# Patient Record
Sex: Male | Born: 1965 | Race: White | Hispanic: No | Marital: Married | Smoking: Never smoker
Health system: Southern US, Community
[De-identification: ages and names within clinical notes are randomized; demographics above are authoritative.]

## PROBLEM LIST (undated history)

## (undated) DIAGNOSIS — M5126 Other intervertebral disc displacement, lumbar region: Secondary | ICD-10-CM

## (undated) DIAGNOSIS — I861 Scrotal varices: Secondary | ICD-10-CM

## (undated) HISTORY — DX: Scrotal varices: I86.1

## (undated) HISTORY — PX: BACK SURGERY: SHX140

## (undated) HISTORY — DX: Other intervertebral disc displacement, lumbar region: M51.26

---

## 2009-12-03 HISTORY — PX: ANTERIOR CRUCIATE LIGAMENT REPAIR: SHX115

## 2012-12-03 DIAGNOSIS — M5126 Other intervertebral disc displacement, lumbar region: Secondary | ICD-10-CM

## 2012-12-03 HISTORY — DX: Other intervertebral disc displacement, lumbar region: M51.26

## 2014-12-17 DIAGNOSIS — G8929 Other chronic pain: Secondary | ICD-10-CM | POA: Insufficient documentation

## 2014-12-17 DIAGNOSIS — M545 Low back pain: Secondary | ICD-10-CM

## 2015-02-01 DIAGNOSIS — I861 Scrotal varices: Secondary | ICD-10-CM

## 2015-02-01 HISTORY — DX: Scrotal varices: I86.1

## 2015-02-09 ENCOUNTER — Ambulatory Visit (INDEPENDENT_AMBULATORY_CARE_PROVIDER_SITE_OTHER): Payer: 59 | Admitting: Family Medicine

## 2015-02-09 ENCOUNTER — Other Ambulatory Visit: Payer: Self-pay | Admitting: Family Medicine

## 2015-02-09 ENCOUNTER — Encounter: Payer: Self-pay | Admitting: Family Medicine

## 2015-02-09 VITALS — BP 110/70 | HR 61 | Temp 97.4°F | Ht 73.0 in | Wt 194.0 lb

## 2015-02-09 DIAGNOSIS — N50812 Left testicular pain: Secondary | ICD-10-CM

## 2015-02-09 DIAGNOSIS — N508 Other specified disorders of male genital organs: Secondary | ICD-10-CM

## 2015-02-09 DIAGNOSIS — Z0000 Encounter for general adult medical examination without abnormal findings: Secondary | ICD-10-CM | POA: Diagnosis not present

## 2015-02-09 NOTE — Progress Notes (Signed)
Office Note 02/09/2015  CC:  Chief Complaint  Patient presents with  . Establish Care   HPI:  Howard Griffin is a [age] y.o. White male who is here to establish care. Patient's most recent primary MD: [place] was in [place], [place] couple years ago. Old records were not reviewed prior to or during today's visit.  Pain in left testicle last couple months.  It is present intermittently but is present more than not.  It is mild/moderate in intensity.  No hx of trauma/injury. Worse when sitting.  He thinks he feels like something feels different down there.  No hx of prostatitis or epididymo-orchitis.  Has not seen an MD for this before.  Past Medical History  Diagnosis Date  . HNP (herniated nucleus pulposus), lumbar 2014    L5/S1    Past Surgical History  Procedure Laterality Date  . Anterior cruciate ligament repair Left 2011    x2 (ACL and med meniscus repaired)  . Back surgery      L5 herniated disc; residual numbness in left foot.    History reviewed. No pertinent family history.  History   Social History  . Marital Status: Married    Spouse Name: N/A  . Number of Children: N/A  . Years of Education: N/A   Occupational History  . Not on file.   Social History Main Topics  . Smoking status: Never Smoker   . Smokeless tobacco: Never Used  . Alcohol Use: No  . Drug Use: No  . Sexual Activity: Not on file   Other Topics Concern  . Not on file   Social History Narrative   Married, 2 daughters.   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   [place]ig from [place]   No T/A/Ds.   Exercises regularly at [employer].   Diet: regular american diet.   Pt adopted, so FH unknown.    Outpatient Encounter Prescriptions as of 02/09/2015  Medication Sig  . gabapentin (NEURONTIN) 100 MG capsule   . [DISCONTINUED] GABAPENTIN PO Take by mouth as needed.    Allergies  Allergen Reactions  . Penicillins Swelling    ROS Review of Systems  Constitutional: Negative for  fever, chills, appetite change and fatigue.  HENT: Negative for congestion, dental problem, ear pain and sore throat.   Eyes: Negative for discharge, redness and visual disturbance.  Respiratory: Negative for cough, chest tightness, shortness of breath and wheezing.   Cardiovascular: Negative for chest pain, palpitations and leg swelling.  Gastrointestinal: Negative for nausea, vomiting, abdominal pain, diarrhea and blood in stool.  Genitourinary: Positive for testicular pain (see HPI). Negative for dysuria, urgency, frequency, hematuria, flank pain and difficulty urinating.  Musculoskeletal: Negative for myalgias, back pain, joint swelling, arthralgias and neck stiffness.  Skin: Negative for pallor and rash.  Neurological: Negative for dizziness, speech difficulty, weakness and headaches.  Hematological: Negative for adenopathy. Does not bruise/bleed easily.  Psychiatric/Behavioral: Negative for confusion and sleep disturbance. The patient is not nervous/anxious.     PE; Blood pressure 110/70, pulse 61, temperature 97.4 F (36.3 C), temperature source Oral, height 6\' 1"  (1.854 m), weight 194 lb (87.998 kg), SpO2 97 %. Gen: Alert, well appearing.  Patient is oriented to person, place, time, and situation. AFFECT: pleasant, lucid thought and speech. ENT: Ears: EACs clear, normal epithelium.  TMs with good light reflex and landmarks bilaterally.  Eyes: no injection, icteris, swelling, or exudate.  EOMI, PERRLA. Nose: no drainage or turbinate edema/swelling.  No injection or  focal lesion.  Mouth: lips without lesion/swelling.  Oral mucosa pink and moist.  Dentition intact and without obvious caries or gingival swelling.  Oropharynx without erythema, exudate, or swelling.  Neck: supple/nontender.  No LAD, mass, or TM.  Carotid pulses 2+ bilaterally, without bruits. CV: RRR, no m/r/g.   LUNGS: CTA bilat, nonlabored resps, good aeration in all lung fields. ABD: soft, NT, ND, BS normal.  No  hepatospenomegaly or mass.  No bruits. EXT: no clubbing, cyanosis, or edema.  Musculoskeletal: no joint swelling, erythema, warmth, or tenderness.  ROM of all joints intact. Skin - no sores or suspicious lesions or rashes or color changes Genitals normal; both testes normal.  Right scrotum/testicle without tenderness, masses, hydroceles, varicoceles, erythema or swelling.   On the left, there is mild tenderness over left epididymus and also in left sided vascular structures/vas area.  However, no mass/nodule palpable. Shaft normal, circumcised, meatus normal without discharge. No inguinal hernia noted. No inguinal lymphadenopathy.  Pertinent labs:  none  ASSESSMENT AND PLAN:   New pt; no pertinent old records to obtain.  Testicular pain, left Given insidious onset/nature of the complaint I have low suspicion of epididymal infection. Suspect possible varicocele and/or epididymal cyst or spermatocele.  No testicular mass was palpated. I will obtain a scrotal u/s w/duplex art/ven dopplers of scrotum to further evaluate.   Health maintenance examination Reviewed age and gender appropriate health maintenance issues (prudent diet, regular exercise, health risks of tobacco and excessive alcohol, use of seatbelts, fire alarms in home, use of sunscreen).  Also reviewed age and gender appropriate health screening as well as vaccine recommendations. Vaccines UTD. Not fasting today, so his health panel labs were ordered for future and he'll return for fasting lab visit at his earliest convenience. He declined HIV screening.   An After Visit Summary was printed and given to the patient.  Return if symptoms worsen or fail to improve, for pt needs lab visit appt for fasting labs.

## 2015-02-09 NOTE — Assessment & Plan Note (Signed)
Reviewed age and gender appropriate health maintenance issues (prudent diet, regular exercise, health risks of tobacco and excessive alcohol, use of seatbelts, fire alarms in home, use of sunscreen).  Also reviewed age and gender appropriate health screening as well as vaccine recommendations. Vaccines UTD. Not fasting today, so his health panel labs were ordered for future and he'll return for fasting lab visit at his earliest convenience. He declined HIV screening.

## 2015-02-09 NOTE — Progress Notes (Signed)
Pre visit review using our clinic review tool, if applicable. No additional management support is needed unless otherwise documented below in the visit note. 

## 2015-02-09 NOTE — Assessment & Plan Note (Signed)
Given insidious onset/nature of the complaint I have low suspicion of epididymal infection. Suspect possible varicocele and/or epididymal cyst or spermatocele.  No testicular mass was palpated. I will obtain a scrotal u/s w/duplex art/ven dopplers of scrotum to further evaluate.

## 2015-02-13 ENCOUNTER — Ambulatory Visit (HOSPITAL_BASED_OUTPATIENT_CLINIC_OR_DEPARTMENT_OTHER): Payer: 59

## 2015-02-13 ENCOUNTER — Ambulatory Visit (HOSPITAL_BASED_OUTPATIENT_CLINIC_OR_DEPARTMENT_OTHER)
Admission: RE | Admit: 2015-02-13 | Discharge: 2015-02-13 | Disposition: A | Discharge status: Home / Self Care | Payer: 59 | Source: Ambulatory Visit | Attending: Family Medicine | Admitting: Family Medicine

## 2015-02-13 DIAGNOSIS — N50812 Left testicular pain: Secondary | ICD-10-CM

## 2015-02-13 DIAGNOSIS — N508 Other specified disorders of male genital organs: Secondary | ICD-10-CM | POA: Insufficient documentation

## 2015-02-14 ENCOUNTER — Other Ambulatory Visit: Payer: Self-pay | Admitting: Family Medicine

## 2015-02-14 DIAGNOSIS — I861 Scrotal varices: Secondary | ICD-10-CM

## 2015-02-21 ENCOUNTER — Other Ambulatory Visit (INDEPENDENT_AMBULATORY_CARE_PROVIDER_SITE_OTHER): Payer: 59

## 2015-02-21 DIAGNOSIS — Z0000 Encounter for general adult medical examination without abnormal findings: Secondary | ICD-10-CM

## 2015-02-21 LAB — CBC WITH DIFFERENTIAL/PLATELET
Basophils Absolute: 0 10*3/uL (ref 0.0–0.1)
Basophils Relative: 0.7 % (ref 0.0–3.0)
EOS PCT: 1.8 % (ref 0.0–5.0)
Eosinophils Absolute: 0.1 10*3/uL (ref 0.0–0.7)
HCT: 46.3 % (ref 39.0–52.0)
HEMOGLOBIN: 15.8 g/dL (ref 13.0–17.0)
LYMPHS PCT: 33.2 % (ref 12.0–46.0)
Lymphs Abs: 1.8 10*3/uL (ref 0.7–4.0)
MCHC: 34.1 g/dL (ref 30.0–36.0)
MCV: 82.5 fl (ref 78.0–100.0)
Monocytes Absolute: 0.5 10*3/uL (ref 0.1–1.0)
Monocytes Relative: 8.8 % (ref 3.0–12.0)
NEUTROS PCT: 55.5 % (ref 43.0–77.0)
Neutro Abs: 3 10*3/uL (ref 1.4–7.7)
Platelets: 233 10*3/uL (ref 150.0–400.0)
RBC: 5.61 Mil/uL (ref 4.22–5.81)
RDW: 14.2 % (ref 11.5–15.5)
WBC: 5.4 10*3/uL (ref 4.0–10.5)

## 2015-02-21 LAB — COMPREHENSIVE METABOLIC PANEL
ALK PHOS: 44 U/L (ref 39–117)
ALT: 26 U/L (ref 0–53)
AST: 23 U/L (ref 0–37)
Albumin: 4.4 g/dL (ref 3.5–5.2)
BUN: 15 mg/dL (ref 6–23)
CHLORIDE: 104 meq/L (ref 96–112)
CO2: 29 mEq/L (ref 19–32)
CREATININE: 0.97 mg/dL (ref 0.40–1.50)
Calcium: 9.4 mg/dL (ref 8.4–10.5)
GFR: 88 mL/min (ref >60.00)
Glucose, Bld: 84 mg/dL (ref 70–99)
Potassium: 4.7 mEq/L (ref 3.5–5.1)
Sodium: 138 mEq/L (ref 135–145)
Total Bilirubin: 1 mg/dL (ref 0.2–1.2)
Total Protein: 6.6 g/dL (ref 6.0–8.3)

## 2015-02-21 LAB — LIPID PANEL
CHOLESTEROL: 183 mg/dL (ref 0–200)
HDL: 62.5 mg/dL (ref >39.00)
LDL CALC: 105 mg/dL — AB (ref 0–99)
NonHDL: 120.5
TRIGLYCERIDES: 80 mg/dL (ref 0.0–149.0)
Total CHOL/HDL Ratio: 3
VLDL: 16 mg/dL (ref 0.0–40.0)

## 2015-02-21 LAB — TSH: TSH: 4.08 u[IU]/mL (ref 0.35–4.50)

## 2015-02-22 NOTE — Progress Notes (Signed)
Quick Note:  Please notify patient that all lab tests were normal. ______

## 2015-03-07 ENCOUNTER — Ambulatory Visit (INDEPENDENT_AMBULATORY_CARE_PROVIDER_SITE_OTHER): Payer: 59 | Admitting: Family Medicine

## 2015-03-07 ENCOUNTER — Encounter: Payer: Self-pay | Admitting: Family Medicine

## 2015-03-07 VITALS — BP 112/78 | HR 63 | Temp 97.6°F | Ht 73.0 in | Wt 196.0 lb

## 2015-03-07 DIAGNOSIS — N508 Other specified disorders of male genital organs: Secondary | ICD-10-CM

## 2015-03-07 DIAGNOSIS — R1013 Epigastric pain: Secondary | ICD-10-CM

## 2015-03-07 DIAGNOSIS — N50812 Left testicular pain: Secondary | ICD-10-CM

## 2015-03-07 LAB — COMPREHENSIVE METABOLIC PANEL
ALT: 20 U/L (ref 0–53)
AST: 21 U/L (ref 0–37)
Albumin: 4.4 g/dL (ref 3.5–5.2)
Alkaline Phosphatase: 50 U/L (ref 39–117)
BILIRUBIN TOTAL: 1.2 mg/dL (ref 0.2–1.2)
BUN: 19 mg/dL (ref 6–23)
CO2: 29 meq/L (ref 19–32)
Calcium: 9.8 mg/dL (ref 8.4–10.5)
Chloride: 103 mEq/L (ref 96–112)
Creatinine, Ser: 1.03 mg/dL (ref 0.40–1.50)
GFR: 82.1 mL/min (ref >60.00)
GLUCOSE: 83 mg/dL (ref 70–99)
Potassium: 4.8 mEq/L (ref 3.5–5.1)
SODIUM: 136 meq/L (ref 135–145)
Total Protein: 6.6 g/dL (ref 6.0–8.3)

## 2015-03-07 LAB — LIPASE: Lipase: 58 U/L (ref 11.0–59.0)

## 2015-03-07 LAB — H. PYLORI ANTIBODY, IGG: H Pylori IgG: NEGATIVE

## 2015-03-07 MED ORDER — PANTOPRAZOLE SODIUM 40 MG PO TBEC: 40.0000 mg | DELAYED_RELEASE_TABLET | Freq: Every day | ORAL | Status: DC

## 2015-03-07 NOTE — Progress Notes (Signed)
Pre visit review using our clinic review tool, if applicable. No additional management support is needed unless otherwise documented below in the visit note. 

## 2015-03-07 NOTE — Progress Notes (Signed)
OFFICE NOTE  03/07/2015  CC:  Chief Complaint  Patient presents with  . Follow-up   HPI: Patient is a 49 y.o. Caucasian male who is here for 1 mo f/u At his initial visit with me he had some testicular pain, scrotal u/s showed left sided varicocele that I felt explained his symptoms.  Since the discomfort was worsening, we decided to refer him to urology. Says his testicular pain has improved since I last saw him and he prefers to cancel urology referall at this time.  Reports about 1 mo history of burning/sharp pain in mid epigastric area.  Some decreased appetite later in the day.  Sitting up it is worse.  Takes NSAID only 1-2 times per week.  No melena or hematochezia.  No change in stool habits.  The pain is daily lately, lasts hours and is constant with some brief periods of being more intense. Denies excessive stress lately.  No GERD, no radiation into his back.  Not consistently worse with eating. Occ gets nausea after eating.  Pertinent PMH:  Past medical, surgical, social, and family history reviewed and changes are noted since last office visit.  MEDS:  Outpatient Prescriptions Prior to Visit  Medication Sig Dispense Refill  . gabapentin (NEURONTIN) 100 MG capsule   0   No facility-administered medications prior to visit.    PE: Blood pressure 112/78, pulse 63, temperature 97.6 F (36.4 C), temperature source Oral, height  (1.854 m), weight 196 lb (88.905 kg), SpO2 97 %. Gen: Alert, well appearing.  Patient is oriented to person, place, time, and situation. GEX:BMWU: no injection, icteris, swelling, or exudate.  EOMI, PERRLA. Mouth: lips without lesion/swelling.  Oral mucosa pink and moist. Oropharynx without erythema, exudate, or swelling.  CV: RRR, no m/r/g.   LUNGS: CTA bilat, nonlabored resps, good aeration in all lung fields. ABD: soft, nondistended.  BS normal.  No HSM or bruit.  He is tender across upper abdomen, mainly in mid-epigastric region, without  rebound or guarding.  Minimal discomfort to palpation in lower abd diffusely.   No mass.  LABS:  Lab Results  Component Value Date   TSH 4.08 02/21/2015   Lab Results  Component Value Date   WBC 5.4 02/21/2015   HGB 15.8 02/21/2015   HCT 46.3 02/21/2015   MCV 82.5 02/21/2015   PLT 233.0 02/21/2015   Lab Results  Component Value Date   CREATININE 0.97 02/21/2015   BUN 15 02/21/2015   NA 138 02/21/2015   K 4.7 02/21/2015   CL 104 02/21/2015   CO2 29 02/21/2015   Lab Results  Component Value Date   ALT 26 02/21/2015   AST 23 02/21/2015   ALKPHOS 44 02/21/2015   BILITOT 1.0 02/21/2015   Lab Results  Component Value Date   CHOL 183 02/21/2015   Lab Results  Component Value Date   HDL 62.50 02/21/2015   Lab Results  Component Value Date   LDLCALC 105* 02/21/2015   Lab Results  Component Value Date   TRIG 80.0 02/21/2015   Lab Results  Component Value Date   CHOLHDL 3 02/21/2015    IMPRESSION AND PLAN:  1) Epigastric pain, suspect dyspepsia vs gastritis. Check CMET, lipase, and H pyloria Ab. Start protonix  qd, adjust diet at least short term, avoid NSAIDs.  2) Left sided scrotal/testic pain, varicocele: improved symptoms.  Pt will cancel his urology referral appt.  An After Visit Summary was printed and given to the patient.  FOLLOW UP:  2 weeks

## 2015-03-21 ENCOUNTER — Encounter: Payer: Self-pay | Admitting: Family Medicine

## 2015-03-21 ENCOUNTER — Ambulatory Visit (INDEPENDENT_AMBULATORY_CARE_PROVIDER_SITE_OTHER): Payer: 59 | Admitting: Family Medicine

## 2015-03-21 VITALS — BP 114/74 | HR 68 | Temp 98.7°F | Resp 18 | Ht 73.0 in | Wt 193.0 lb

## 2015-03-21 DIAGNOSIS — K2970 Gastritis, unspecified, without bleeding: Secondary | ICD-10-CM | POA: Diagnosis not present

## 2015-03-21 NOTE — Progress Notes (Signed)
Pre visit review using our clinic review tool, if applicable. No additional management support is needed unless otherwise documented below in the visit note. 

## 2015-03-21 NOTE — Progress Notes (Signed)
OFFICE NOTE  03/21/2015  CC:  Chief Complaint  Patient presents with  . Follow-up     HPI: Patient is a 49 y.o. Caucasian male who is here for 2 week f/u dyspepsia/gastritis. Epigastric burning pain is improved; was occurring nearly constantly and now he estimates he feels it about 2 times per day.  No melena or hematochezia. No medication side effects from the daily PPI.  Pertinent PMH:  Past medical, surgical, social, and family history reviewed and no changes are noted since last office visit.  MEDS:  Outpatient Prescriptions Prior to Visit  Medication Sig Dispense Refill  . pantoprazole (PROTONIX) 40 MG tablet Take 1 tablet (40 mg total) by mouth daily. 30 tablet 3  . gabapentin (NEURONTIN) 100 MG capsule   0   No facility-administered medications prior to visit.    PE: Blood pressure 114/74, pulse 68, temperature 98.7 F (37.1 C), temperature source Temporal, resp. rate 18, height 6\' 1"  (1.854 m), weight 193 lb (87.544 kg), SpO2 97 %. Gen: Alert, well appearing.  Patient is oriented to person, place, time, and situation. AFFECT: pleasant, lucid thought and speech. No further exam today.  IMPRESSION AND PLAN:  Dyspepsia/gastritis; improving on daily PPI appropriately. Reviewed labs from last visit with pt: normal CMET, lipase, and neg H pylori Ab. Recommended he complete 6-8 wk course of daily PPI then do ween off PPI over 10-14d. Signs/symptoms to call or return for were reviewed and pt expressed understanding.  An After Visit Summary was printed and given to the patient.  FOLLOW UP: prn

## 2015-04-29 ENCOUNTER — Encounter: Payer: Self-pay | Admitting: Family Medicine

## 2016-01-25 IMAGING — US US SCROTUM
1 series · 13 of 25 positions shown · non-contrast
Comparison: None.

CLINICAL DATA: Left testicular pain for 2 months. Left epididymal
tenderness.

EXAM:
SCROTAL ULTRASOUND
DOPPLER ULTRASOUND OF THE TESTICLES
TECHNIQUE: Complete ultrasound examination of the testicles, epididymis, and
other scrotal structures was performed. Color and spectral Doppler
ultrasound were also utilized to evaluate blood flow to the
testicles.

[Series 1: us scrotum · 0.07mm/px · 13 of 95 slices shown]
[im 1/95]
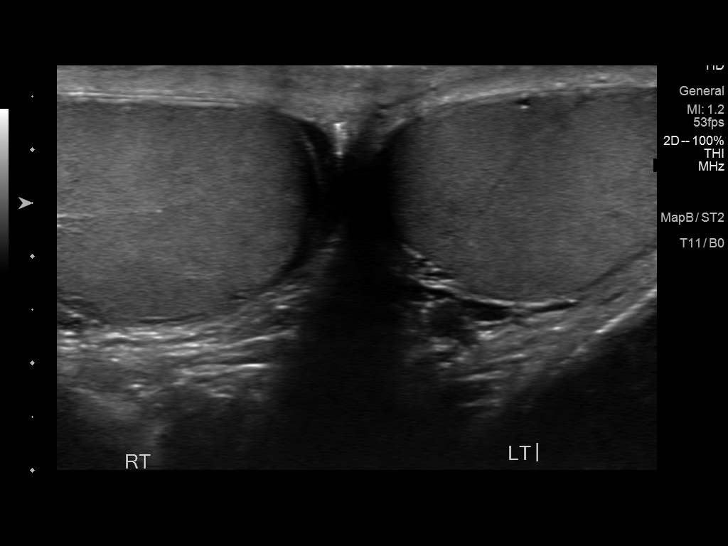
[im 8/95]
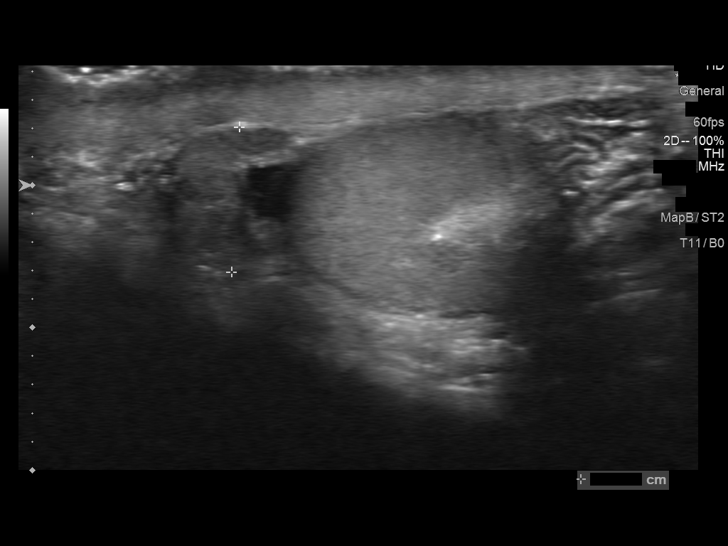
[im 16/95]
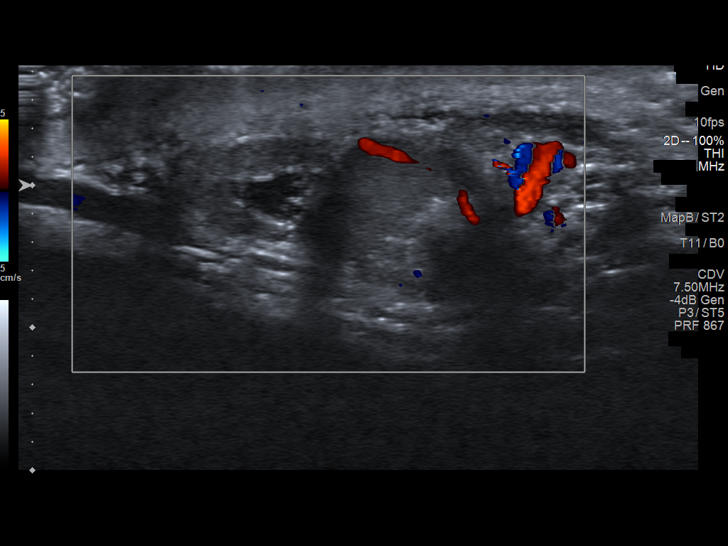
[im 24/95]
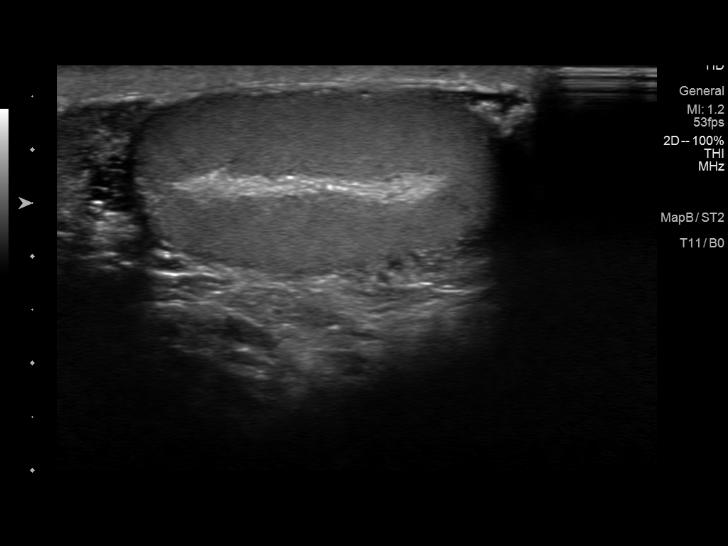
[im 32/95]
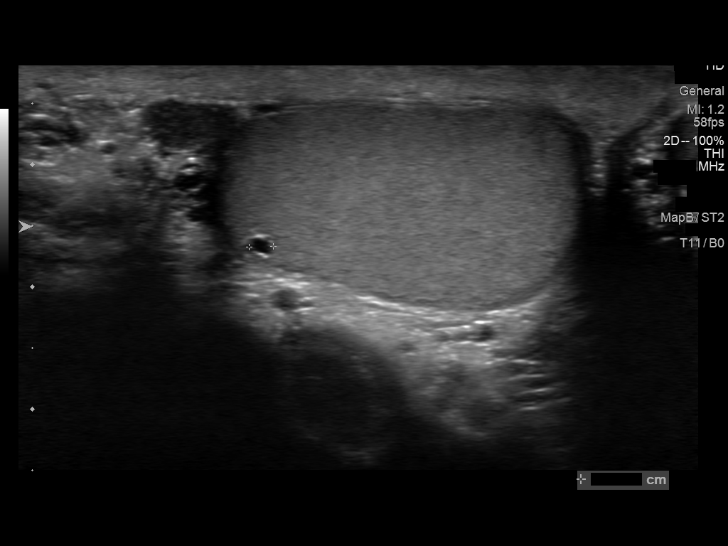
[im 40/95]
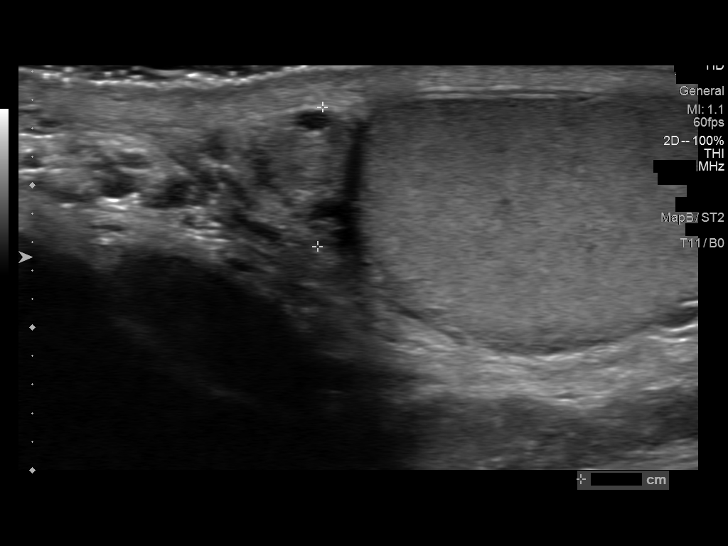
[im 48/95]
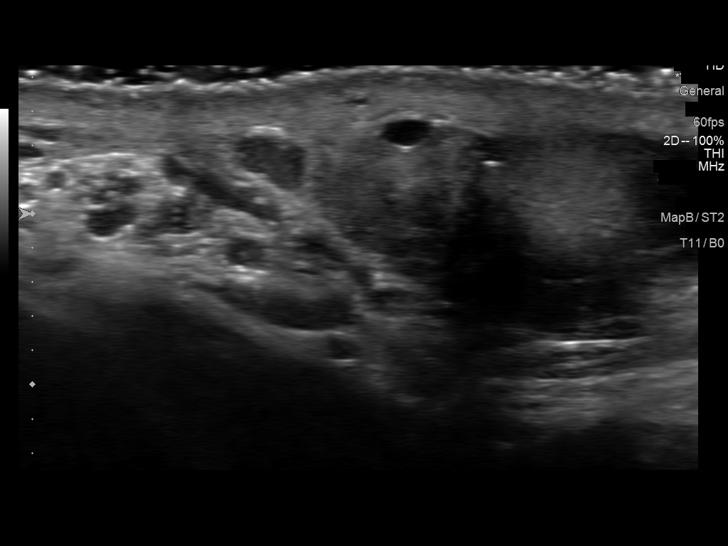
[im 55/95]
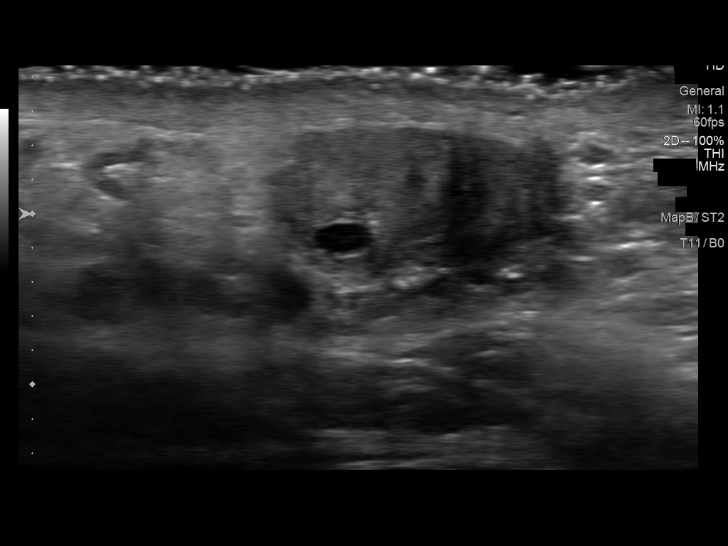
[im 63/95]
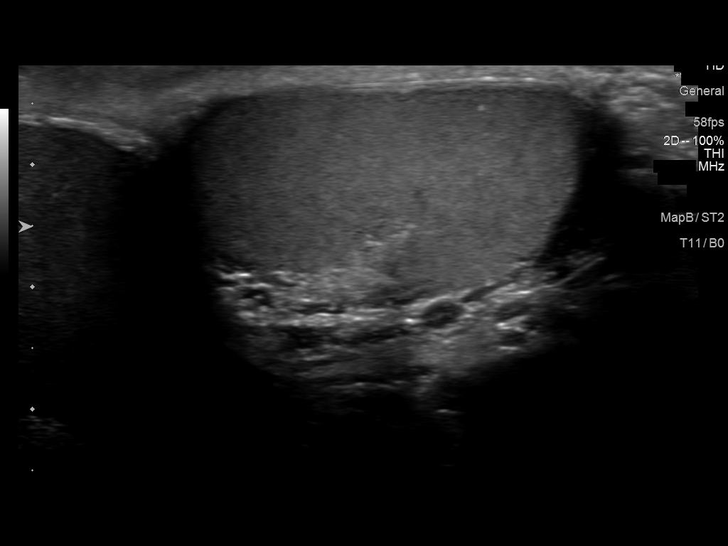
[im 71/95]
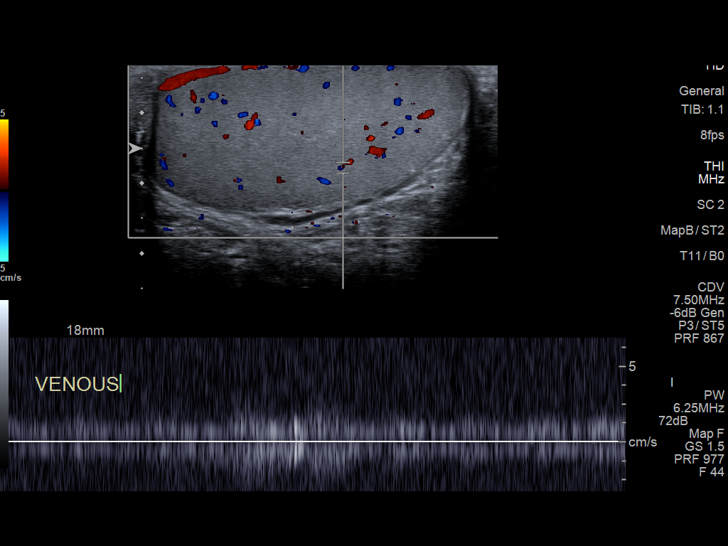
[im 79/95]
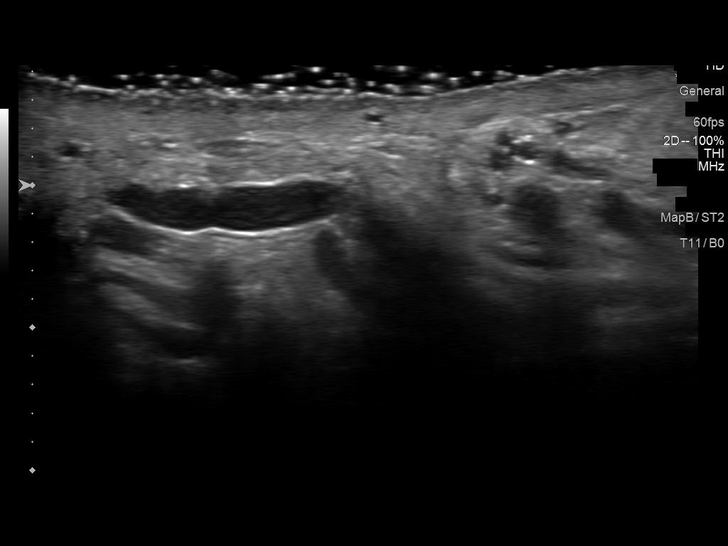
[im 87/95]
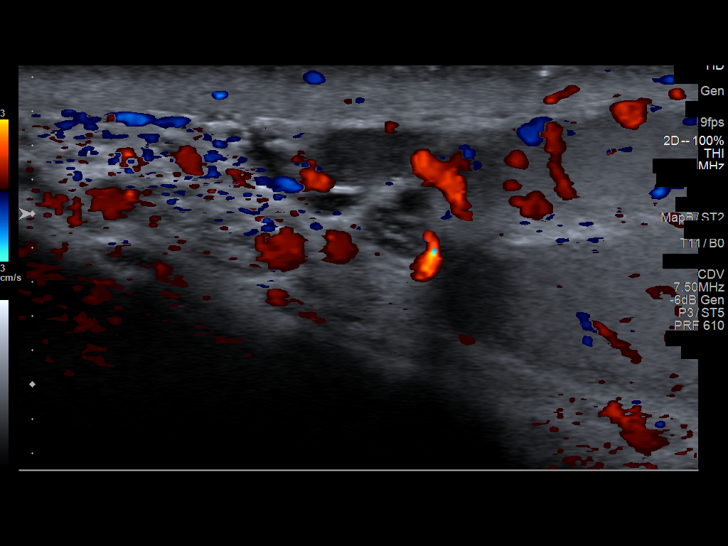
[im 95/95]
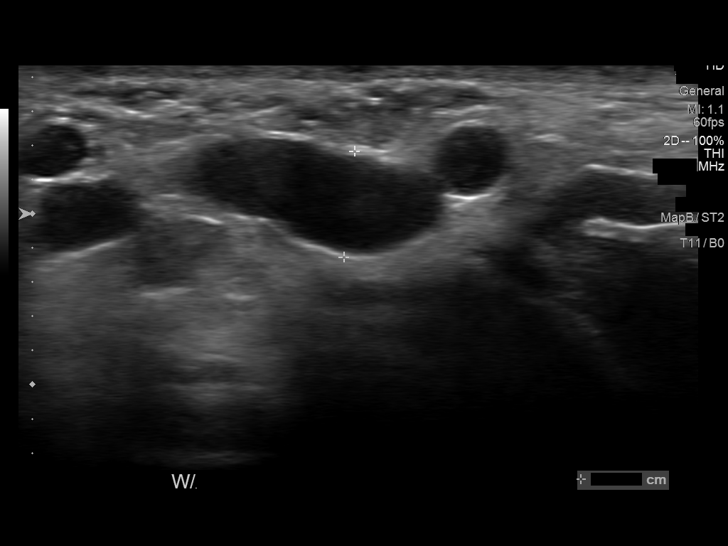

[13 of 25 positions shown; findings below may reference images not displayed]

FINDINGS: Right testicle

Measurements: 4.9 x 2.1 x 3.5 cm. 3 mm anechoic right testicular
mass most consistent with a small cyst.. No mass or microlithiasis
visualized.

Left testicle

Measurements: 4.9 x 2 x 3.3 cm. Punctate echogenic foci scattered in
the testicle as can be seen with testicular microlithiasis.. No
mass.

Right epididymis: Normal in size and appearance. 6 mm right
epididymal cyst.

Left epididymis: Normal in size and appearance. Two left epididymal
cysts measuring 3 mm each.

Hydrocele:  Small left hydrocele.

Varicocele: Bilateral varicoceles, left worse than right. There are
areas of no Doppler signal in the left varicocele which may reflect
slow flow versus thrombosis.

Pulsed Doppler interrogation of both testes demonstrates normal low
resistance arterial and venous waveforms bilaterally.
IMPRESSION: 1. Bilateral varicoceles, left worse than right. There are areas of
no Doppler signal in the left varicocele which may reflect slow flow
versus partial thrombosis.
2. No testicular torsion.
3. Left testicular microlithiasis. Current literature suggests that
testicular microlithiasis is not a significant independent risk
factor for development of testicular carcinoma, and that follow up
imaging is not warranted in the absence of other risk factors.
Monthly testicular self-examination and annual physical exams are
considered appropriate surveillance. If patient has other risk
factors for testicular carcinoma, then referral to Urology should be
considered. (Reference: Sylla, et al.: A 5-Year Follow up Study
of Asymptomatic Men with Testicular Microlithiasis. J Urol 4112;

## 2017-10-10 ENCOUNTER — Encounter: Payer: 59 | Admitting: Family Medicine

## 2017-10-30 ENCOUNTER — Ambulatory Visit (INDEPENDENT_AMBULATORY_CARE_PROVIDER_SITE_OTHER): Payer: 59 | Admitting: Family Medicine

## 2017-10-30 ENCOUNTER — Encounter: Payer: Self-pay | Admitting: Family Medicine

## 2017-10-30 ENCOUNTER — Other Ambulatory Visit: Payer: Self-pay

## 2017-10-30 VITALS — BP 112/70 | HR 65 | Temp 97.4°F | Resp 16 | Ht 73.0 in | Wt 197.5 lb

## 2017-10-30 DIAGNOSIS — F5101 Primary insomnia: Secondary | ICD-10-CM | POA: Diagnosis not present

## 2017-10-30 DIAGNOSIS — R430 Anosmia: Secondary | ICD-10-CM

## 2017-10-30 DIAGNOSIS — Z125 Encounter for screening for malignant neoplasm of prostate: Secondary | ICD-10-CM

## 2017-10-30 DIAGNOSIS — R6882 Decreased libido: Secondary | ICD-10-CM

## 2017-10-30 DIAGNOSIS — Z0000 Encounter for general adult medical examination without abnormal findings: Secondary | ICD-10-CM

## 2017-10-30 DIAGNOSIS — H9313 Tinnitus, bilateral: Secondary | ICD-10-CM | POA: Diagnosis not present

## 2017-10-30 DIAGNOSIS — Z0001 Encounter for general adult medical examination with abnormal findings: Secondary | ICD-10-CM

## 2017-10-30 DIAGNOSIS — Z1211 Encounter for screening for malignant neoplasm of colon: Secondary | ICD-10-CM

## 2017-10-30 DIAGNOSIS — Z23 Encounter for immunization: Secondary | ICD-10-CM | POA: Diagnosis not present

## 2017-10-30 MED ORDER — ZOLPIDEM TARTRATE 10 MG PO TABS: 10.0000 mg | ORAL_TABLET | Freq: Every evening | ORAL | 1 refills | Status: DC | PRN

## 2017-10-30 NOTE — Patient Instructions (Signed)

## 2017-10-30 NOTE — Progress Notes (Signed)
OFFICE VISIT  10/30/2017   CC:  Chief Complaint  Patient presents with  . Annual Exam    Pt is not fasting.   HPI:    Patient is a 51 y.o.  male who presents for annual health maintenance exam.  Eyes: exam a few weeks ago. Exercise: 3-4 times per week at [employer]. Dental: preventatives UTD. Diet: travels a lot and eats out a lot.  Has chronic ringing in ears, getting worse.  No hx of excessive noise exposure.  Feels same in both ears. Does not complain of hearing loss.    C/o decreased libido for over a year, mild ED.  Asks for testosterone level check.  C/o >1 yr maintenance insomnia.  Initiates sleep fine, 10-10:30, wakes up around 1 AM and urinates and takes 3 hours to get back to sleep typically.  Does not endorse ruminating over things/anxiety, no RLS.  He stays in his bed ---does not get up and read or watch TV.  Drinks only 1-2 cups of coffee in mornings.  Feels tired/sleepy most afternoons.  Tried melatonin but no help.  No other otc sleep aid has been tried.  Denies depressed mood.  Past Medical History:  Diagnosis Date  . 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  Marland Kitchen HNP (herniated nucleus pulposus), lumbar 2014   L5/S1    Past Surgical History:  Procedure Laterality Date  . ANTERIOR CRUCIATE LIGAMENT REPAIR Left 2011   x2 (ACL and med meniscus repaired)  . BACK SURGERY     L5 herniated disc; residual numbness in left foot.   Social History   Socioeconomic History  . Marital status: Married    Spouse name: None  . Number of children: None  . Years of education: None  . Highest education level: None  Social Needs  . Financial resource strain: None  . Food insecurity - worry: None  . Food insecurity - inability: None  . Transportation needs - medical: None  . Transportation needs - non-medical: None  Occupational History  . None  Tobacco Use  . Smoking status: Never Smoker  . Smokeless tobacco: Never Used   Substance and Sexual Activity  . Alcohol use: No    Alcohol/week: 0.0 oz  . Drug use: No  . Sexual activity: None  Other Topics Concern  . None  Social History Narrative   Married, 2 daughters.   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   No T/A/Ds.   Exercises regularly at [employer].   Diet: regular american diet.   Pt adopted, so FH unknown.   History reviewed. No pertinent family history.  MEDS: none  Allergies  Allergen Reactions  . Penicillins Swelling    ROS Review of Systems  Constitutional: Negative for appetite change, chills, fatigue and fever.  HENT: Negative for congestion, dental problem, ear pain and sore throat.   Eyes: Negative for discharge, redness and visual disturbance.  Respiratory: Negative for cough, chest tightness, shortness of breath and wheezing.   Cardiovascular: Negative for chest pain, palpitations and leg swelling.  Gastrointestinal: Negative for abdominal pain, blood in stool, diarrhea, nausea and vomiting.  Genitourinary: Negative for difficulty urinating, dysuria, flank pain, frequency, hematuria and urgency.  Musculoskeletal: Negative for arthralgias, back pain, joint swelling, myalgias and neck stiffness.  Skin: Negative for pallor and rash.  Neurological: Negative for dizziness, speech difficulty, weakness and headaches.       Chronic tinnitus bilat.  Lately has had decreased sense of smell.  Hematological: Negative for adenopathy. Does not bruise/bleed easily.  Psychiatric/Behavioral: Positive for sleep disturbance. Negative for confusion. The patient is not nervous/anxious.     PE: Blood pressure 112/70, pulse 65, temperature (!) 97.4 F (36.3 C), temperature source Oral, resp. rate 16, height 6\' 1"  (1.854 m), weight 197 lb 8 oz (89.6 kg), SpO2 95 %. Body mass index is 26.06 kg/m.  Gen: Alert, well appearing.  Patient is oriented to person, place, time, and situation. AFFECT: pleasant, lucid thought  and speech. ENT: Ears: EACs clear, normal epithelium.  TMs with good light reflex and landmarks bilaterally.  Eyes: no injection, icteris, swelling, or exudate.  EOMI, PERRLA. Nose: no drainage or turbinate edema/swelling.  No injection or focal lesion.  Mouth: lips without lesion/swelling.  Oral mucosa pink and moist.  Dentition intact and without obvious caries or gingival swelling.  Oropharynx without erythema, exudate, or swelling.  Neck: supple/nontender.  No LAD, mass, or TM.  Carotid pulses 2+ bilaterally, without bruits. CV: RRR, no m/r/g.   LUNGS: CTA bilat, nonlabored resps, good aeration in all lung fields. ABD: soft, NT, ND, BS normal.  No hepatospenomegaly or mass.  No bruits. EXT: no clubbing, cyanosis, or edema.  Musculoskeletal: no joint swelling, erythema, warmth, or tenderness.  ROM of all joints intact. Skin - no sores or suspicious lesions or rashes or color changes Rectal exam: negative without mass, lesions or tenderness, PROSTATE EXAM: smooth and symmetric without nodules or tenderness.   LABS:  Lab Results  Component Value Date   TSH 4.08 02/21/2015   Lab Results  Component Value Date   WBC 5.4 02/21/2015   HGB 15.8 02/21/2015   HCT 46.3 02/21/2015   MCV 82.5 02/21/2015   PLT 233.0 02/21/2015   Lab Results  Component Value Date   CREATININE 1.03 03/07/2015   BUN 19 03/07/2015   NA 136 03/07/2015   K 4.8 03/07/2015   CL 103 03/07/2015   CO2 29 03/07/2015   Lab Results  Component Value Date   ALT 20 03/07/2015   AST 21 03/07/2015   ALKPHOS 50 03/07/2015   BILITOT 1.2 03/07/2015   Lab Results  Component Value Date   CHOL 183 02/21/2015   Lab Results  Component Value Date   HDL 62.50 02/21/2015   Lab Results  Component Value Date   LDLCALC 105 (H) 02/21/2015   Lab Results  Component Value Date   TRIG 80.0 02/21/2015   Lab Results  Component Value Date   CHOLHDL 3 02/21/2015    IMPRESSION AND PLAN:  1) Maintenance insomnia, chronic:  no behavioral abnormalities to work on. Will do trial of ambien 10mg  qhs.  Therapeutic expectations and side effect profile of medication discussed today.  Patient's questions answered. He does not want to rely on a sleep med for every night use, and I agree with this notion.  2) Health maintenance exam: Reviewed age and gender appropriate health maintenance issues (prudent diet, regular exercise, health risks of tobacco and excessive alcohol, use of seatbelts, fire alarms in home, use of sunscreen).  Also reviewed age and gender appropriate health screening as well as vaccine recommendations. Vaccines: Tdap UTD.  Flu vaccine-- pt declined today.  Shingrix--#1 given today. Labs: HP labs--future ordered for when pt fasting. Prostate ca screening: DRE normal today , PSA. Colon cancer screening: screening colonoscopy recommended--referral to GI ordered today.  3) Chronic tinnitus, no hearing deficit.  ENT eval in the past--neg/no treatments offered.  I will look into any connection between tinnitus and anosmia (which he has been having lately), and get back with him if I find anything.  4) Impaired libido: per pt request I'll check testosterone level today.  An After Visit Summary was printed and given to the patient.  FOLLOW UP: Return in about 4 weeks (around 11/27/2017) for f/u insomnia.  Signed:  Santiago BumpersPhil Mallery Harshman, MD           10/30/2017

## 2017-10-30 NOTE — Addendum Note (Signed)
Addended by: Regan RakersAY, LAURA K on: 10/30/2017 02:04 PM   Modules accepted: Orders

## 2017-11-01 ENCOUNTER — Other Ambulatory Visit (INDEPENDENT_AMBULATORY_CARE_PROVIDER_SITE_OTHER): Payer: 59

## 2017-11-01 DIAGNOSIS — Z0000 Encounter for general adult medical examination without abnormal findings: Secondary | ICD-10-CM

## 2017-11-01 DIAGNOSIS — Z125 Encounter for screening for malignant neoplasm of prostate: Secondary | ICD-10-CM | POA: Diagnosis not present

## 2017-11-01 DIAGNOSIS — R6882 Decreased libido: Secondary | ICD-10-CM | POA: Diagnosis not present

## 2017-11-01 LAB — LIPID PANEL
Cholesterol: 184 mg/dL (ref 0–200)
HDL: 65 mg/dL (ref >39.00)
LDL CALC: 108 mg/dL — AB (ref 0–99)
NonHDL: 119.36
Total CHOL/HDL Ratio: 3
Triglycerides: 58 mg/dL (ref 0.0–149.0)
VLDL: 11.6 mg/dL (ref 0.0–40.0)

## 2017-11-01 LAB — COMPREHENSIVE METABOLIC PANEL
ALK PHOS: 52 U/L (ref 39–117)
ALT: 23 U/L (ref 0–53)
AST: 21 U/L (ref 0–37)
Albumin: 4.6 g/dL (ref 3.5–5.2)
BUN: 16 mg/dL (ref 6–23)
CHLORIDE: 103 meq/L (ref 96–112)
CO2: 28 mEq/L (ref 19–32)
CREATININE: 1.08 mg/dL (ref 0.40–1.50)
Calcium: 9.5 mg/dL (ref 8.4–10.5)
GFR: 76.88 mL/min (ref >60.00)
Glucose, Bld: 87 mg/dL (ref 70–99)
Potassium: 4.2 mEq/L (ref 3.5–5.1)
SODIUM: 137 meq/L (ref 135–145)
TOTAL PROTEIN: 7 g/dL (ref 6.0–8.3)
Total Bilirubin: 1.3 mg/dL — ABNORMAL HIGH (ref 0.2–1.2)

## 2017-11-01 LAB — CBC WITH DIFFERENTIAL/PLATELET
BASOS ABS: 0 10*3/uL (ref 0.0–0.1)
Basophils Relative: 0.5 % (ref 0.0–3.0)
Eosinophils Absolute: 0.1 10*3/uL (ref 0.0–0.7)
Eosinophils Relative: 1.9 % (ref 0.0–5.0)
HCT: 47.5 % (ref 39.0–52.0)
HEMOGLOBIN: 16 g/dL (ref 13.0–17.0)
Lymphocytes Relative: 18.6 % (ref 12.0–46.0)
Lymphs Abs: 1 10*3/uL (ref 0.7–4.0)
MCHC: 33.6 g/dL (ref 30.0–36.0)
MCV: 86.2 fl (ref 78.0–100.0)
Monocytes Absolute: 0.6 10*3/uL (ref 0.1–1.0)
Monocytes Relative: 11 % (ref 3.0–12.0)
Neutro Abs: 3.8 10*3/uL (ref 1.4–7.7)
Neutrophils Relative %: 68 % (ref 43.0–77.0)
Platelets: 214 10*3/uL (ref 150.0–400.0)
RBC: 5.52 Mil/uL (ref 4.22–5.81)
RDW: 14.8 % (ref 11.5–15.5)
WBC: 5.6 10*3/uL (ref 4.0–10.5)

## 2017-11-01 LAB — PSA: PSA: 0.53 ng/mL (ref 0.10–4.00)

## 2017-11-01 LAB — TSH: TSH: 4.02 u[IU]/mL (ref 0.35–4.50)

## 2017-11-04 LAB — TESTOSTERONE TOTAL,FREE,BIO, MALES
Albumin: 4.5 g/dL (ref 3.6–5.1)
SEX HORMONE BINDING: 36 nmol/L (ref 10–50)
TESTOSTERONE BIOAVAILABLE: 150.9 ng/dL (ref >=110.0)
Testosterone, Free: 73.4 pg/mL (ref 46.0–224.0)
Testosterone: 572 ng/dL (ref 250–827)

## 2017-11-20 ENCOUNTER — Encounter: Payer: Self-pay | Admitting: Family Medicine

## 2017-11-20 ENCOUNTER — Ambulatory Visit (INDEPENDENT_AMBULATORY_CARE_PROVIDER_SITE_OTHER): Payer: 59 | Admitting: Family Medicine

## 2017-11-20 VITALS — BP 115/69 | HR 63 | Temp 97.5°F | Resp 16 | Ht 73.0 in | Wt 197.0 lb

## 2017-11-20 DIAGNOSIS — F5101 Primary insomnia: Secondary | ICD-10-CM | POA: Diagnosis not present

## 2017-11-20 MED ORDER — TRAZODONE HCL 50 MG PO TABS: ORAL_TABLET | ORAL | 1 refills | Status: DC

## 2017-11-20 NOTE — Progress Notes (Signed)
OFFICE VISIT  11/20/2017   CC:  Chief Complaint  Patient presents with  . Follow-up    Insomnia     HPI:    Patient is a 51 y.o.  male who presents for 3 week f/u insomnia (maintenance). Ambien trial started at last visit.  This med has not helped any significant amount. He was a little groggy in mornings when he took this.  He usually goes to bed 10 pm, gets out of bed 6AM. Wakes up 2 AM and takes a couple hours to get back to sleep. He describes good sleep hygiene.  No excessive worry.  No depression.  Past Medical History:  Diagnosis Date  . 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  Marland Kitchen. HNP (herniated nucleus pulposus), lumbar 2014   L5/S1    Past Surgical History:  Procedure Laterality Date  . ANTERIOR CRUCIATE LIGAMENT REPAIR Left 2011   x2 (ACL and med meniscus repaired)  . BACK SURGERY     L5 herniated disc; residual numbness in left foot.    Outpatient Medications Prior to Visit  Medication Sig Dispense Refill  . zolpidem (AMBIEN) 10 MG tablet Take 1 tablet (10 mg total) by mouth at bedtime as needed for sleep. 15 tablet 1   No facility-administered medications prior to visit.     Allergies  Allergen Reactions  . Penicillins Swelling    ROS As per HPI  PE: Blood pressure 115/69, pulse 63, temperature (!) 97.5 F (36.4 C), temperature source Oral, resp. rate 16, height 6\' 1"  (1.854 m), weight 197 lb (89.4 kg), SpO2 96 %. Gen: Alert, well appearing.  Patient is oriented to person, place, time, and situation. AFFECT: pleasant, lucid thought and speech. No further exam.  LABS:  Lab Results  Component Value Date   TSH 4.02 11/01/2017   Lab Results  Component Value Date   WBC 5.6 11/01/2017   HGB 16.0 11/01/2017   HCT 47.5 11/01/2017   MCV 86.2 11/01/2017   PLT 214.0 11/01/2017   Lab Results  Component Value Date   CREATININE 1.08 11/01/2017   BUN 16 11/01/2017   NA 137 11/01/2017   K 4.2 11/01/2017    CL 103 11/01/2017   CO2 28 11/01/2017   Lab Results  Component Value Date   ALT 23 11/01/2017   AST 21 11/01/2017   ALKPHOS 52 11/01/2017   BILITOT 1.3 (H) 11/01/2017   Lab Results  Component Value Date   CHOL 184 11/01/2017   Lab Results  Component Value Date   HDL 65.00 11/01/2017   Lab Results  Component Value Date   LDLCALC 108 (H) 11/01/2017   Lab Results  Component Value Date   TRIG 58.0 11/01/2017   Lab Results  Component Value Date   CHOLHDL 3 11/01/2017   Lab Results  Component Value Date   PSA 0.53 11/01/2017     IMPRESSION AND PLAN:  Insomnia: no improvement with ambien. Will try trazodone 50mg , 1-2 qhs prn. Therapeutic expectations and side effect profile of medication discussed today.  Patient's questions answered. He can call if trial of this is ineffective or intolerable, and the next step would be trial of lorazepam 0.5, 1-2 qhs prn. We'll leave f/u open ended at this time.  After an effective med is found we'll determine appropriate regular f/u interval for him/insomnia.  An After Visit Summary was printed and given to the patient.   FOLLOW UP: Return for as needed.  Signed:  Michele McalpinePhil  Ethelbert Thain, MD           11/20/2017

## 2017-12-23 ENCOUNTER — Other Ambulatory Visit: Payer: Self-pay | Admitting: Family Medicine

## 2017-12-23 NOTE — Telephone Encounter (Signed)
[facility]  RF request for trazodone LOV: 10/30/17 Next ov: None Last written: 11/20/17 #30 w/ 1RF  Please advise. Thanks.

## 2018-01-21 ENCOUNTER — Other Ambulatory Visit: Payer: Self-pay

## 2018-01-21 MED ORDER — TRAZODONE HCL 50 MG PO TABS: ORAL_TABLET | ORAL | 5 refills | Status: DC

## 2018-02-06 ENCOUNTER — Encounter: Payer: Self-pay | Admitting: *Deleted

## 2018-08-14 ENCOUNTER — Other Ambulatory Visit: Payer: Self-pay | Admitting: Family Medicine

## 2018-08-14 NOTE — Telephone Encounter (Signed)
RF request for trazodone LOV: 10/30/17 Next ov: None Last written: 01/21/18 #60 w/ 5RF  Please advise. Thanks.

## 2018-08-15 NOTE — Telephone Encounter (Signed)
Left message for pt to call back  °

## 2018-08-15 NOTE — Telephone Encounter (Signed)
Will rx this for 3 mo supply and pt needs to come in for CPE and f/u insomnia in 3 mo.-thx

## 2018-11-13 ENCOUNTER — Other Ambulatory Visit: Payer: Self-pay | Admitting: *Deleted

## 2018-11-13 NOTE — Telephone Encounter (Signed)
Pt is over due for follow up. Needs office visit for more refills.    SW pt, he stated that [place] and has not gotten set up with a PCP there. I advised him that he needs [place] [facility] or come in to see Dr. Milinda CaveMcGowen for a follow up.   OKay to send in one month supply?   Please advise. Thanks.

## 2018-11-14 ENCOUNTER — Other Ambulatory Visit: Payer: Self-pay | Admitting: Family Medicine

## 2018-11-17 MED ORDER — TRAZODONE HCL 50 MG PO TABS: ORAL_TABLET | ORAL | 0 refills | Status: AC

## 2018-11-17 NOTE — Telephone Encounter (Signed)
He has had 3 months to find a new doctor. I'll send in 2 weeks supply, but NO FURTHER RX's.-thx

## 2018-11-17 NOTE — Telephone Encounter (Signed)
Left detailed message on cell vm, okay per DPR.  

## 2018-12-13 ENCOUNTER — Other Ambulatory Visit: Payer: Self-pay | Admitting: Family Medicine

## 2018-12-15 NOTE — Telephone Encounter (Signed)
RF request for trazadon.  Patient has not been seen since 11/20/17.   Tried to contact patient to find out if the request was supposed to come to our office.  [place].  Please find out if patient needs this RF from us and if so, he will need an appointment.

## 2018-12-17 NOTE — Telephone Encounter (Signed)
Pt was advised in December that we would only supply him with a 2 weeks, he needed to find new PCP.
